# Patient Record
Sex: Male | Born: 1945 | Race: White | Hispanic: No | Marital: Married | Smoking: Never smoker
Health system: Southern US, Community
[De-identification: ages and names within clinical notes are randomized; demographics above are authoritative.]

## PROBLEM LIST (undated history)

## (undated) DIAGNOSIS — E079 Disorder of thyroid, unspecified: Secondary | ICD-10-CM

## (undated) DIAGNOSIS — E7800 Pure hypercholesterolemia, unspecified: Secondary | ICD-10-CM

## (undated) HISTORY — PX: APPENDECTOMY: SHX54

## (undated) HISTORY — PX: HERNIA REPAIR: SHX51

## (undated) HISTORY — PX: TONSILLECTOMY: SUR1361

---

## 2018-12-23 ENCOUNTER — Encounter: Payer: Self-pay | Admitting: Emergency Medicine

## 2018-12-23 ENCOUNTER — Emergency Department: Payer: No Typology Code available for payment source

## 2018-12-23 ENCOUNTER — Other Ambulatory Visit: Payer: Self-pay

## 2018-12-23 ENCOUNTER — Emergency Department
Admission: EM | Admit: 2018-12-23 | Discharge: 2018-12-23 | Disposition: A | Discharge status: Home / Self Care | Payer: No Typology Code available for payment source | Attending: Student in an Organized Health Care Education/Training Program | Admitting: Student in an Organized Health Care Education/Training Program

## 2018-12-23 DIAGNOSIS — R42 Dizziness and giddiness: Secondary | ICD-10-CM | POA: Diagnosis not present

## 2018-12-23 HISTORY — DX: Disorder of thyroid, unspecified: E07.9

## 2018-12-23 HISTORY — DX: Pure hypercholesterolemia, unspecified: E78.00

## 2018-12-23 LAB — COMPREHENSIVE METABOLIC PANEL
ALT: 20 U/L (ref 0–44)
AST: 21 U/L (ref 15–41)
Albumin: 4.1 g/dL (ref 3.5–5.0)
Alkaline Phosphatase: 103 U/L (ref 38–126)
Anion gap: 7 (ref 5–15)
BUN: 17 mg/dL (ref 8–23)
CO2: 26 mmol/L (ref 22–32)
Calcium: 9 mg/dL (ref 8.9–10.3)
Chloride: 106 mmol/L (ref 98–111)
Creatinine, Ser: 0.96 mg/dL (ref 0.61–1.24)
GFR calc Af Amer: >60 mL/min (ref >60)
GFR calc non Af Amer: >60 mL/min (ref >60)
Glucose, Bld: 152 mg/dL — ABNORMAL HIGH (ref 70–99)
Potassium: 3.6 mmol/L (ref 3.5–5.1)
Sodium: 139 mmol/L (ref 135–145)
Total Bilirubin: 0.9 mg/dL (ref 0.3–1.2)
Total Protein: 7 g/dL (ref 6.5–8.1)

## 2018-12-23 LAB — TROPONIN I (HIGH SENSITIVITY): Troponin I (High Sensitivity): 2 ng/L (ref <18)

## 2018-12-23 LAB — CBC WITH DIFFERENTIAL/PLATELET
Abs Immature Granulocytes: 0.01 10*3/uL (ref 0.00–0.07)
Basophils Absolute: 0.1 10*3/uL (ref 0.0–0.1)
Basophils Relative: 1 %
Eosinophils Absolute: 0.2 10*3/uL (ref 0.0–0.5)
Eosinophils Relative: 3 %
HCT: 40.8 % (ref 39.0–52.0)
Hemoglobin: 13.5 g/dL (ref 13.0–17.0)
Immature Granulocytes: 0 %
Lymphocytes Relative: 25 %
Lymphs Abs: 1.5 10*3/uL (ref 0.7–4.0)
MCH: 29.3 pg (ref 26.0–34.0)
MCHC: 33.1 g/dL (ref 30.0–36.0)
MCV: 88.5 fL (ref 80.0–100.0)
Monocytes Absolute: 0.7 10*3/uL (ref 0.1–1.0)
Monocytes Relative: 11 %
Neutro Abs: 3.8 10*3/uL (ref 1.7–7.7)
Neutrophils Relative %: 60 %
Platelets: 205 10*3/uL (ref 150–400)
RBC: 4.61 MIL/uL (ref 4.22–5.81)
RDW: 13.1 % (ref 11.5–15.5)
WBC: 6.2 10*3/uL (ref 4.0–10.5)
nRBC: 0 % (ref 0.0–0.2)
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
## 2018-12-23 NOTE — ED Provider Notes (Signed)
[facility] Emergency Department Provider Note    First MD Initiated Contact with Patient 12/23/18 1714     (approximate)  I have reviewed the triage vital signs and the nursing notes.   HISTORY  Chief Complaint Dizziness    HPI Robert Castillo is a 73 y.o. male below listed past medical history presents the ER for several weeks of intermittent lightheadedness.  States it frequently occurs while he is at work.  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  States he frequently feels lightheaded and almost foggy but denies any blurry vision, numbness, tingling, lateralizing weakness.  Does not feel like the room is spinning around.  States that last episode was last night.  States it is frequently very brief in nature.  Denies any chest pain or shortness of breath.  Denies any nausea or vomiting.    Past Medical History:  Diagnosis Date  . High cholesterol   . Thyroid disease    No family history on file. Past Surgical History:  Procedure Laterality Date  . APPENDECTOMY    . HERNIA REPAIR    . TONSILLECTOMY     There are no active problems to display for this patient.     Prior to Admission medications   Not on File    Allergies Patient has no allergy information on record.    Social History Social History   Tobacco Use  . Smoking status: Never Smoker  . Smokeless tobacco: Never Used  Substance Use Topics  . Alcohol use: Never    Frequency: Never  . Drug use: Not on file    Review of Systems Patient denies headaches, rhinorrhea, blurry vision, numbness, shortness of breath, chest pain, edema, cough, abdominal pain, nausea, vomiting, diarrhea, dysuria, fevers, rashes or hallucinations unless otherwise stated above in HPI. ____________________________________________   PHYSICAL EXAM:  VITAL SIGNS: Vitals:   12/23/18 1554  BP: (!) 153/79  Pulse:  87  Resp: 17  Temp: 98.5 F (36.9 C)  SpO2: 93%    Constitutional: Alert and oriented.  Eyes: Conjunctivae are normal.  Head: Atraumatic. Nose: No congestion/rhinnorhea. Mouth/Throat: Mucous membranes are moist.   Neck: No stridor. Painless ROM.  Cardiovascular: Normal rate, regular rhythm.  No murmurs appreciated.  Good peripheral circulation. Respiratory: Normal respiratory effort.  No retractions. Lungs CTAB. Gastrointestinal: Soft and nontender. No distention. No abdominal bruits. No CVA tenderness. Genitourinary:  Musculoskeletal: No lower extremity tenderness nor edema.  No joint effusions. Neurologic:  CN- intact.  No facial droop, Normal FNF.  Normal heel to shin.  Sensation intact bilaterally. Normal speech and language. No gross focal neurologic deficits are appreciated. No gait instability.  Benign HINTs exam Skin:  Skin is warm, dry and intact. No rash noted. Psychiatric: Mood and affect are normal. Speech and behavior are normal.  ____________________________________________   LABS (all labs ordered are listed, but only abnormal results are displayed)  Results for orders placed or performed during the hospital encounter of 12/23/18 (from the past 24 hour(s))  CBC with Differential     Status: None   Collection Time: 12/23/18  3:58 PM  Result Value Ref Range   WBC 6.2 4.0 - 10.5 K/uL   RBC 4.61 4.22 - 5.81 MIL/uL   Hemoglobin 13.5 13.0 - 17.0 g/dL   HCT 16.140.8 09.639.0 - 04.552.0 %   MCV 88.5 80.0 - 100.0 fL   MCH  29.3 26.0 - 34.0 pg   MCHC 33.1 30.0 - 36.0 g/dL   RDW 96.013.1 45.411.5 - 09.815.5 %   Platelets 205 150 - 400 K/uL   nRBC 0.0 0.0 - 0.2 %   Neutrophils Relative % 60 %   Neutro Abs 3.8 1.7 - 7.7 K/uL   Lymphocytes Relative 25 %   Lymphs Abs 1.5 0.7 - 4.0 K/uL   Monocytes Relative 11 %   Monocytes Absolute 0.7 0.1 - 1.0 K/uL   Eosinophils Relative 3 %   Eosinophils Absolute 0.2 0.0 - 0.5 K/uL   Basophils Relative 1 %   Basophils Absolute 0.1 0.0 - 0.1 K/uL    Immature Granulocytes 0 %   Abs Immature Granulocytes 0.01 0.00 - 0.07 K/uL  Comprehensive metabolic panel     Status: Abnormal   Collection Time: 12/23/18  3:58 PM  Result Value Ref Range   Sodium 139 135 - 145 mmol/L   Potassium 3.6 3.5 - 5.1 mmol/L   Chloride 106 98 - 111 mmol/L   CO2 26 22 - 32 mmol/L   Glucose, Bld 152 (H) 70 - 99 mg/dL   BUN 17 8 - 23 mg/dL   Creatinine, Ser 1.190.96 0.61 - 1.24 mg/dL   Calcium 9.0 8.9 - 14.710.3 mg/dL   Total Protein 7.0 6.5 - 8.1 g/dL   Albumin 4.1 3.5 - 5.0 g/dL   AST 21 15 - 41 U/L   ALT 20 0 - 44 U/L   Alkaline Phosphatase 103 38 - 126 U/L   Total Bilirubin 0.9 0.3 - 1.2 mg/dL   GFR calc non Af Amer >60 >60 mL/min   GFR calc Af Amer >60 >60 mL/min   Anion gap 7 5 - 15  Troponin I (High Sensitivity)     Status: None   Collection Time: 12/23/18  3:58 PM  Result Value Ref Range   Troponin I (High Sensitivity) 2 <18 ng/L   ____________________________________________  EKG My review and personal interpretation at Time: 15:54   Indication: dizziness  Rate: 90  Rhythm: sinus Axis: normal Other: normal intervals, no stemi ____________________________________________  RADIOLOGY  I personally reviewed all radiographic images ordered to evaluate for the above acute complaints and reviewed radiology reports and findings.  These findings were personally discussed with the patient.  Please see medical record for radiology report.  ____________________________________________   PROCEDURES  Procedure(s) performed:  Procedures    Critical Care performed: no ____________________________________________   INITIAL IMPRESSION / ASSESSMENT AND PLAN / ED COURSE  Pertinent labs & imaging results that were available during my care of the patient were reviewed by me and considered in my medical decision making (see chart for details).   DDX: Dehydration, orthostasis, anemia, CVA, vertigo, ACS or dysrhythmia.  Robert Castillo is a 73 y.o. who presents  to the ED with symptoms of lightheadedness intermittently over the past several weeks.  He is afebrile hemodynamically stable well-appearing.  His neuro exam is reassuring without any deficits.  Blood work is reassuring.  EKG shows no evidence of ischemia and his troponin is negative.  Chest x-ray shows no evidence of cardiomegaly or infiltrates.  No evidence of effusion.  Had mild increase in heart rate with standing but not overtly orthostatic.  Does seem that symptoms are more consistent with dehydration.  Will encourage p.o. intake and follow-up with PCP.     The patient was evaluated in Emergency Department today for the symptoms described in the history of present illness. He/she was evaluated  in the context of the global COVID-19 pandemic, which necessitated consideration that the patient might be at risk for infection with the SARS-CoV-2 virus that causes COVID-19. Institutional protocols and algorithms that pertain to the evaluation of patients at risk for COVID-19 are in a state of rapid change based on information released by regulatory bodies including the CDC and federal and state organizations. These policies and algorithms were followed during the patient's care in the ED.  As part of my medical decision making, I reviewed the following data within the [facility] notes reviewed and incorporated, Labs reviewed, notes from prior ED visits and [place] Controlled Substance Database   ____________________________________________   FINAL CLINICAL IMPRESSION(S) / ED DIAGNOSES  Final diagnoses:  Lightheadedness      NEW MEDICATIONS STARTED DURING THIS VISIT:  New Prescriptions   No medications on file     Note:  This document was prepared using Dragon voice recognition software and may include unintentional dictation errors.    Merlyn Lot, MD 12/23/18 [phone]

## 2018-12-23 NOTE — ED Notes (Signed)
ED Provider at bedside. 

## 2018-12-23 NOTE — ED Notes (Signed)
Pt alert and oriented X 4, stable for discharge. RR even and unlabored, color WNL. Discussed discharge instructions and follow up when appropriate. Instructed to follow up with ER for any life threatening symptoms or concerns that patient or family of patient may have  

## 2018-12-23 NOTE — Discharge Instructions (Addendum)
Follow up with Dr. Heber Beresford.  Please drink plenty of fluids for the next few days with the goal of having clear urine.

## 2020-10-05 IMAGING — CT CT HEAD WITHOUT CONTRAST
3 series · 16 of 47 positions shown, 19 images · non-contrast
Comparison: None.

CLINICAL DATA: Episodic vertigo.  Dizziness 2-3 weeks

EXAM:
CT HEAD WITHOUT CONTRAST
TECHNIQUE: Contiguous axial images were obtained from the base of the skull
through the vertex without intravenous contrast.

[Series 2: head wo · axial · 0.44mm/px · z∈[-131,-6]mm · 10 of 31 slices shown, 13 images]
[im 3/31  brain]
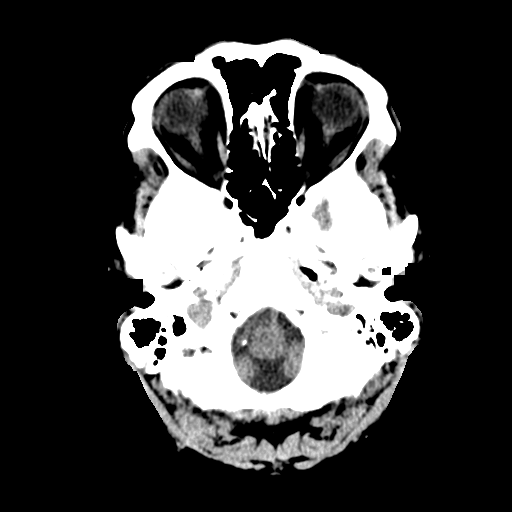
[im 3/31  bone]
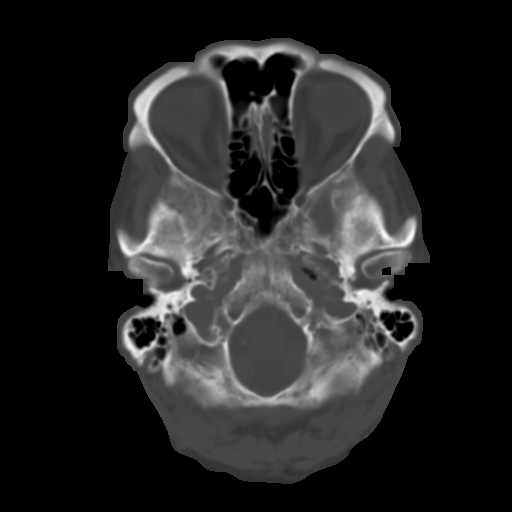
[im 6/31  brain]
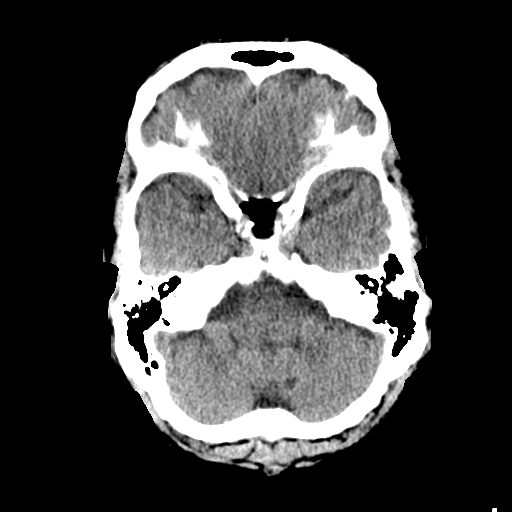
[im 9/31  brain]
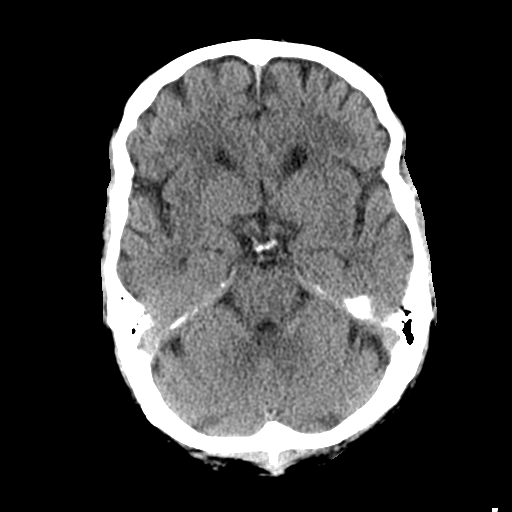
[im 11/31  brain]
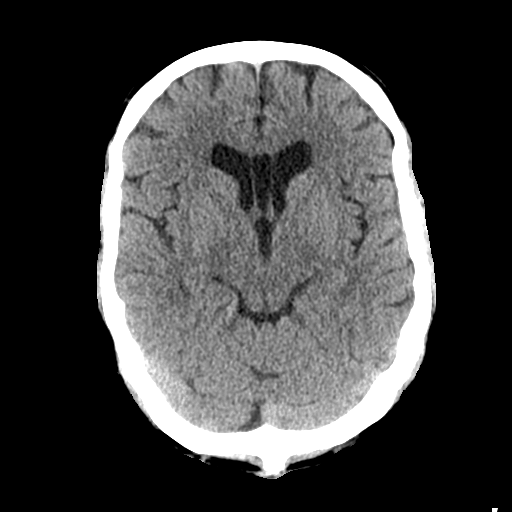
[im 14/31  brain]
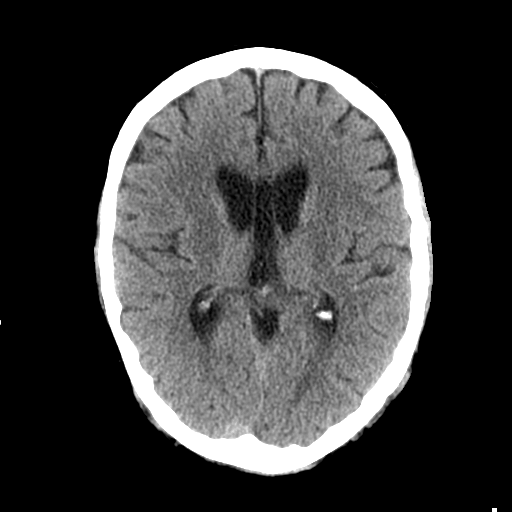
[im 14/31  bone]
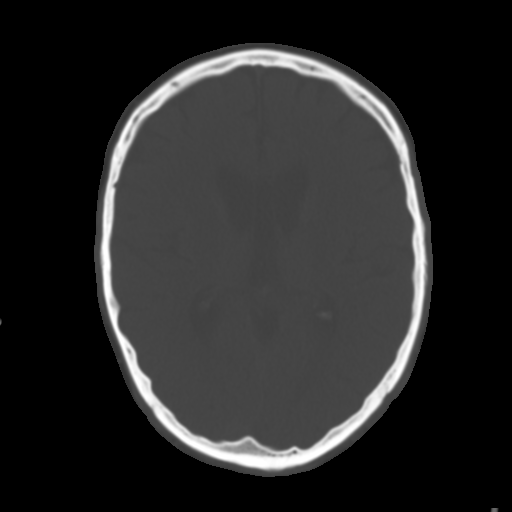
[im 17/31  brain]
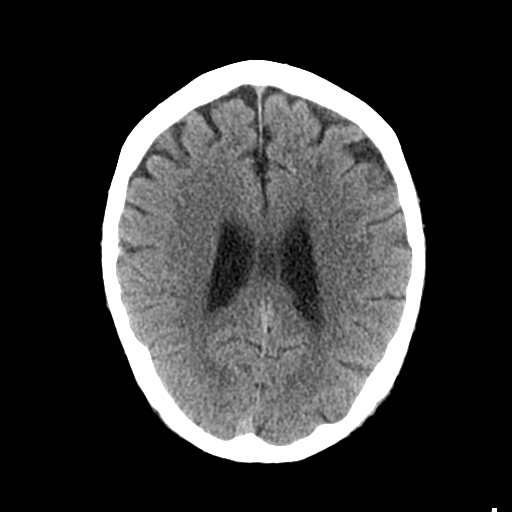
[im 20/31  brain]
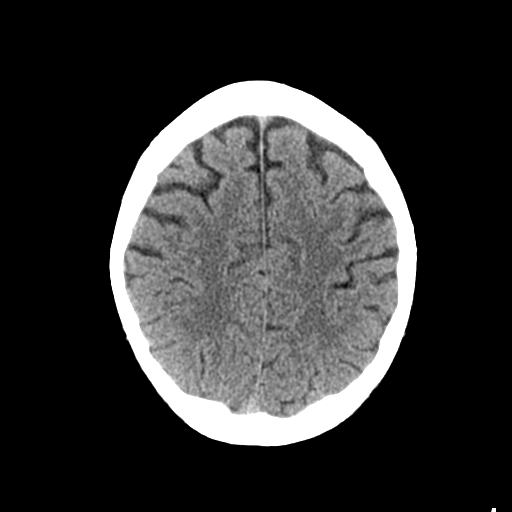
[im 23/31  brain]
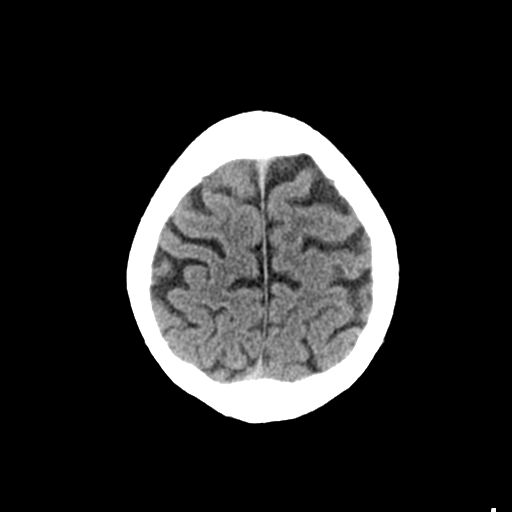
[im 25/31  brain]
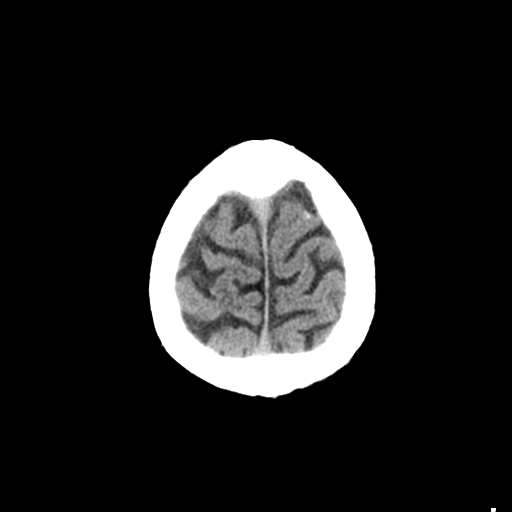
[im 25/31  bone]
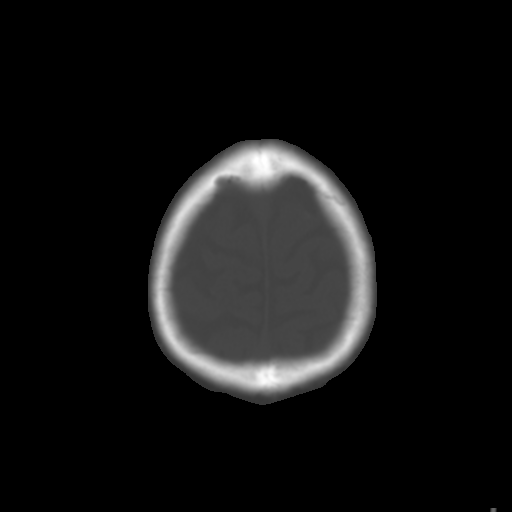
[im 28/31  brain]
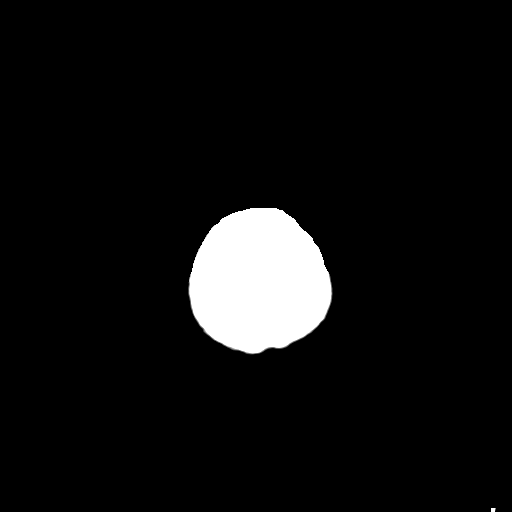

[Series 4: coronal soft tissue · coronal · 0.31mm/px · 3 of 66 slices shown]
[im 22/66  brain]
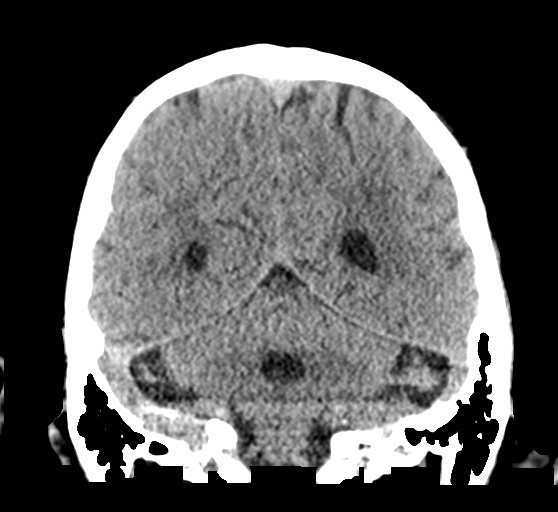
[im 29/66  brain]
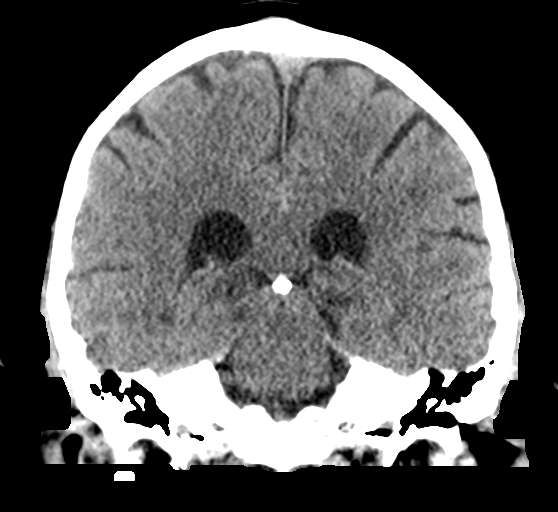
[im 37/66  brain]
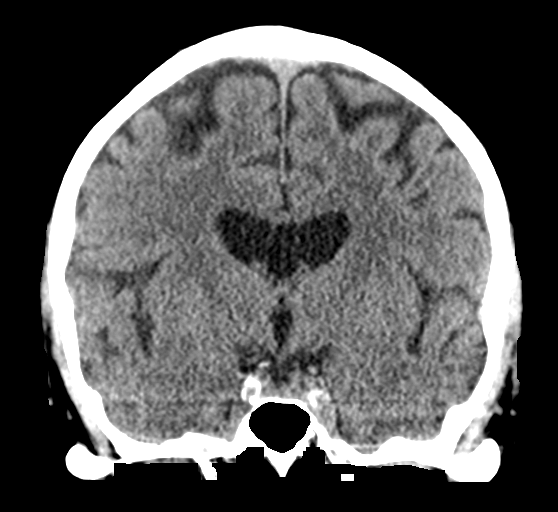

[Series 5: sagittal soft tissue · sagittal · 0.31mm/px · 3 of 55 slices shown]
[im 19/55  brain]
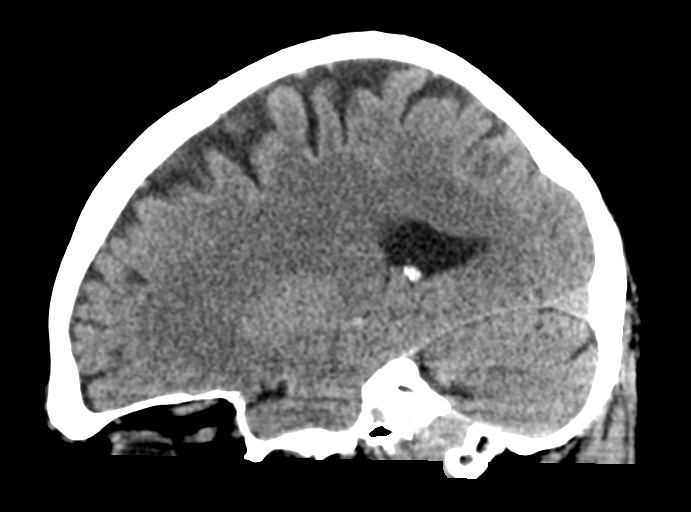
[im 28/55  brain]
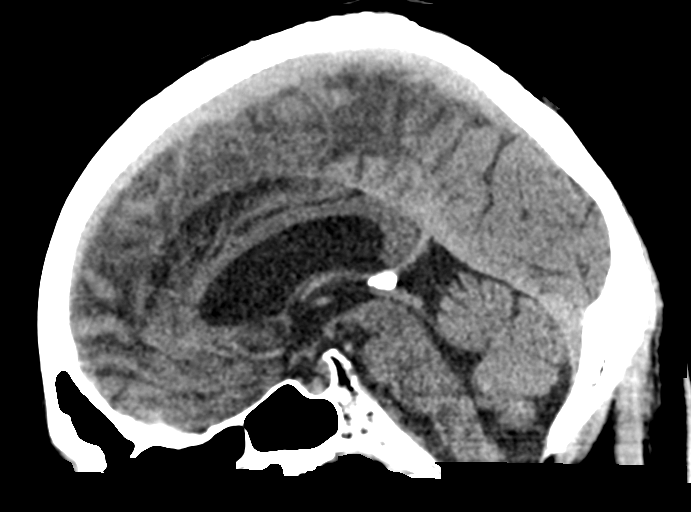
[im 37/55  brain]
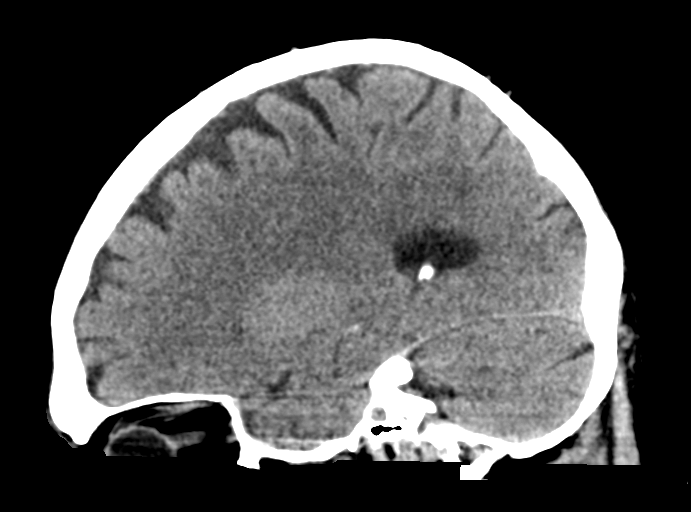

[16 of 47 positions shown; findings below may reference images not displayed]

FINDINGS: Brain: Mild atrophy. Negative for hydrocephalus. Negative for acute
infarct. Negative for hemorrhage, mass, or fluid collection. No
midline shift.

Vascular: Negative for hyperdense vessel

Skull: Negative

Sinuses/Orbits: Negative

Other: None
IMPRESSION: Mild atrophy.  No acute abnormality.

## 2020-10-05 IMAGING — DX PORTABLE CHEST - 1 VIEW
1 series · 2 of 2 positions shown · non-contrast
Comparison: None.

CLINICAL DATA: Dizziness for 2-3 weeks.

EXAM:
PORTABLE CHEST 1 VIEW

[Series 1: chest ap · 0.14mm/px · 2 of 2 slices shown]
[im 1/2]
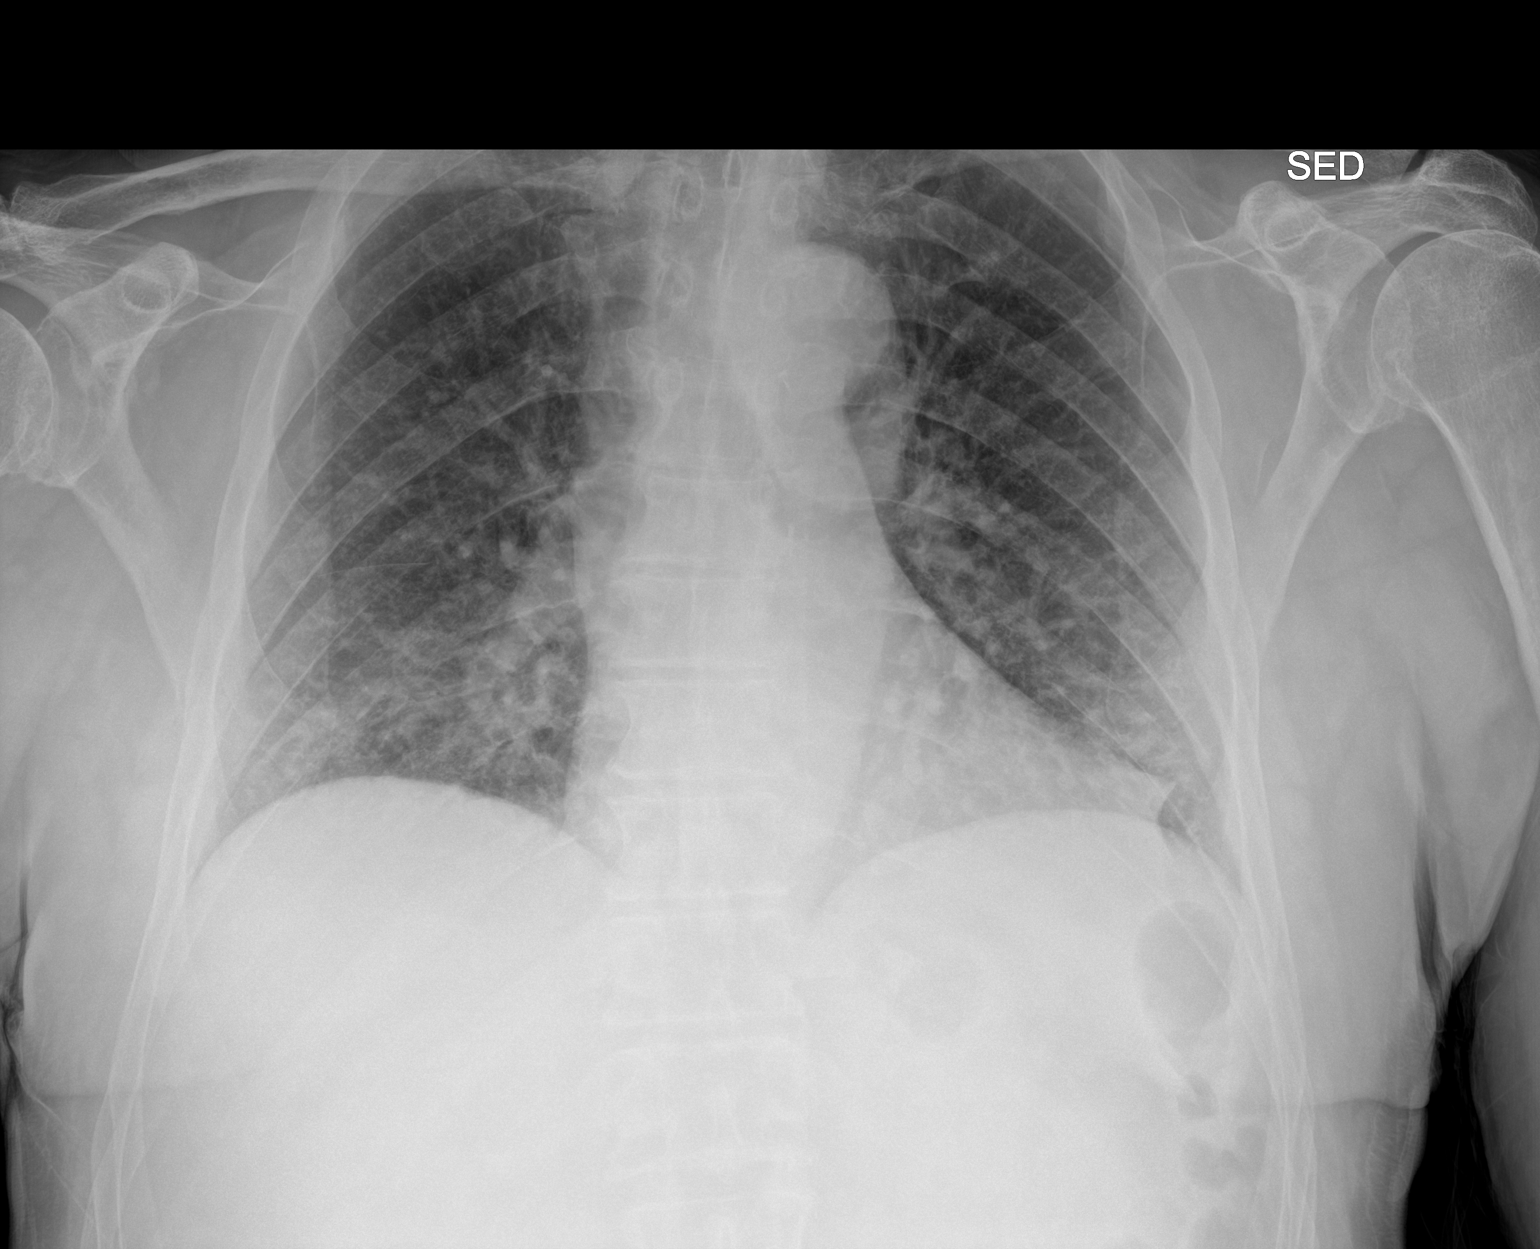
[im 2/2]
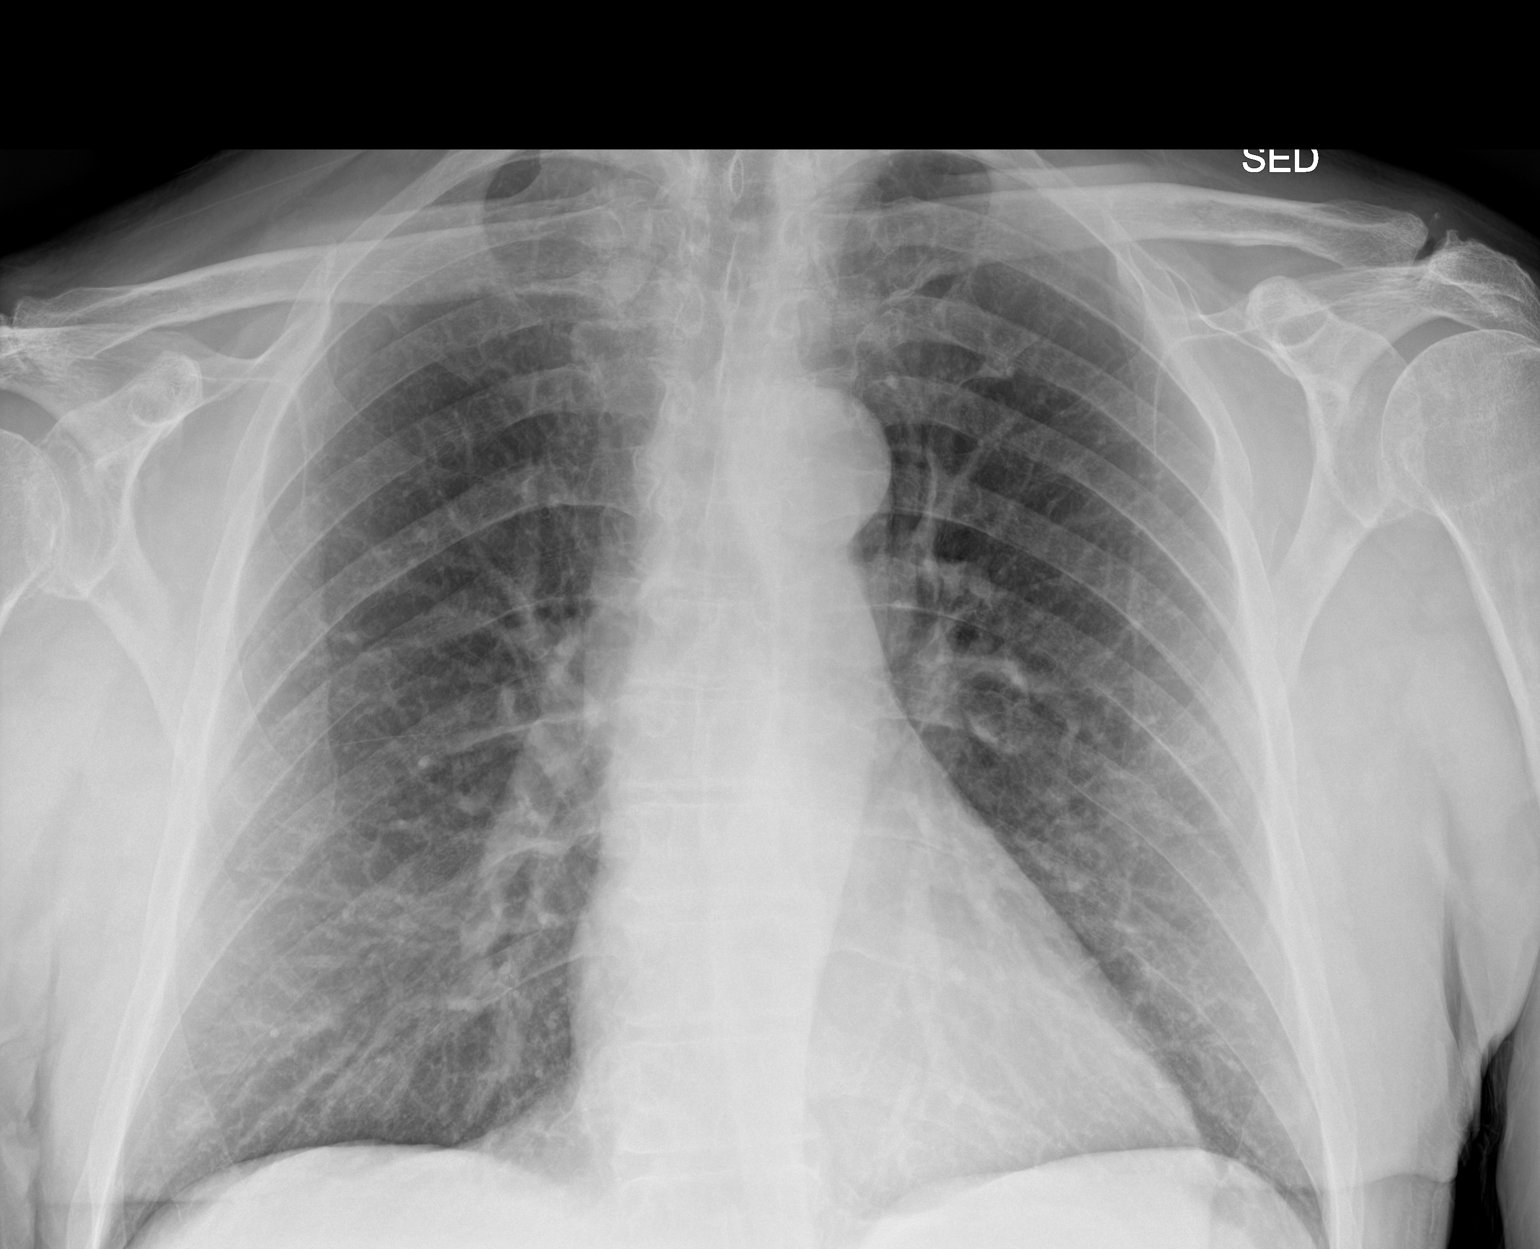

[2 of 2 positions shown; findings below may reference images not displayed]

FINDINGS: The lungs are clear. Heart size is normal. No pneumothorax or
pleural effusion. No acute or focal bony abnormality.
IMPRESSION: No acute disease.

## 2023-06-07 NOTE — Progress Notes (Signed)
 Patient ID: Robert Castillo is a 78 y.o. male who presents for a Medicare Annual Wellness Visit, with additional evaluation and management of Shortness of breath (see separate note in this encounter).  Informant: Patient came to appointment alone..  Assessment/Plan:    Medicare Annual Wellness Visit  Risks identified The patient was educated and counseled on the following: There were no significant safety risks identified at today's visit. Is the patient currently using a prescribed opioid medication? No  Advanced care planning -- Patient has a living will and I am willing to follow the patient's wishes as expressed in the advance directive.  Personalized Prevention Plan A personalized prevention plan was reviewed with the patient and a written copy was provided for personal records (available for review in Patient Instructions).  The following preventive services were advised: Influenza: Current Flu Tdap: will pursue vaccine at local pharmacy Shingrix: will pursue vaccine at local pharmacy RSV: will pursue vaccine at local pharmacy PSA: risks and benefits of prostate cancer screening were carefully reviewed -- ordered  No follow-ups on file.  Subjective:   Health Risk Assessment (HRA)  The HRA was completed and reviewed. The chart was updated where appropriate.  Medical/Family History No Known Allergies  Outpatient Medications Prior to Visit  Medication Sig Dispense Refill  . atorvastatin (LIPITOR) 40 MG tablet Take 1 tablet (40 mg total) by mouth daily. 90 tablet 3  . levothyroxine (SYNTHROID) 88 MCG tablet Take 1 tablet (88 mcg total) by mouth daily. 90 tablet 0  . mv-mins-folic-lycopene-ginkgo (ONE-A-DAY MEN'S 50PLUS,GINKGO,) 400-300-120 mcg-mcg-mg Tab Take by mouth.    SABRA UNABLE TO FIND Prevagen one daily     No facility-administered medications prior to visit.    Past Medical History:  Diagnosis Date  . Diverticulosis of colon 06/24/2020  . Hyperlipemia   .  Hypothyroidism   . Status post tooth extraction 06/08/14   wisdom teeth left upper and lower    Past Surgical History:  Procedure Laterality Date  . APPENDECTOMY     age 74  . PR COLSC FLX W/REMOVAL LESION BY HOT BX FORCEPS N/A 06/24/2020   Procedure: COLONOSCOPY, FLEXIBLE, PROXIMAL TO SPLENIC FLEXURE; W/REMOVAL TUMOR/POLYP/OTHER LESION, HOT BX FORCEP/CAUTE;  Surgeon: Raghid Everette Ruddy, MD;  Location: [facility];  Service: General Surgery  . PR REPAIR ING HERNIA,5+Y/O,REDUCIBL Right 07/12/2015   Procedure: REPAIR INITIAL INGUINAL HERNIA, AGE 48 YEARS OR OLDER; REDUCIBLE;  Surgeon: Renella Everette Ruddy, MD;  Location: [facility];  Service: General Surgery  . PR UPPER GI ENDOSCOPY,BIOPSY N/A 06/24/2020   Procedure: UGI ENDOSCOPY; WITH BIOPSY, SINGLE OR MULTIPLE;  Surgeon: Renella Everette Ruddy, MD;  Location: [facility];  Service: General Surgery  . TONSILLECTOMY     age [age]    Family History  Problem Relation Age of Onset  . Cancer Mother        lymphnode  . Alzheimer's disease Father   . Heart disease Father   . Other Sister        male issues not sure details  . Arthritis Sister   . Diabetes Brother   . No Known Problems Daughter   . No Known Problems Son   . No Known Problems Son   . Alcohol abuse Neg Hx   . Drug abuse Neg Hx   . Asthma Neg Hx   . COPD Neg Hx   . Depression Neg Hx   . Hyperlipidemia Neg Hx   . Hypertension Neg Hx   . Stroke Neg Hx   .  Thyroid disease Neg Hx   . Hearing loss Neg Hx   . Vision loss Neg Hx   . Mental illness Neg Hx   . Mental Retardation Neg Hx   . Miscarriages / Stillbirths Neg Hx   . Learning disabilities Neg Hx     Functional Abilities/ Level of Safety Home safety concerns: No ADL needs: none iADL needs: none Fall in the last year: No Hearing difficulty: No     Psychosocial risks Past history of depression: No Today PHQ-2 Total Score : 0    Alcohol risk: No Social History   Tobacco Use  . Smoking status: Never    Passive  exposure: Never  . Smokeless tobacco: Never  Vaping Use  . Vaping status: Never Used  Substance Use Topics  . Alcohol use: No  . Drug use: No    Preventive Care Health Maintenance  Topic Date Due  . DTaP/Tdap/Td Vaccines (1 - Tdap) Never done  . Zoster Vaccines (1 of 2) Never done  . COVID-19 Vaccine (4 - 2024-25 season) 01/06/2023  . Influenza Vaccine (1) 01/06/2023  . Medicare Annual Wellness Visit (AWV)  07/05/2023  . Pneumococcal Vaccine 50+  Completed  . Hepatitis C Screen  Completed  . Colonoscopy  Discontinued     Immunization History  Administered Date(s) Administered  . COVID-19 VACCINE,MRNA(MODERNA)(PF) 07/25/2019, 08/28/2019, 09/21/2019  . Influenza Virus Vaccine, unspecified formulation 02/26/2014, 02/16/2016, 02/12/2017, 02/13/2018, 03/05/2019  . PNEUMOCOCCAL POLYSACCHARIDE 23-VALENT 08/12/2013  . Pneumococcal Conjugate 13-Valent 09/06/2014    Current Providers  Patient Care Team: Rosan Cheryal Heinz, MD as PCP - General (Internal Medicine) Rosan Cheryal Heinz, MD as PCP - Deberah Ruddy, Renella Manns, MD (General Surgery) [facility] (Dentistry)  Current Suppliers (DME) None   Objective:   Vital Signs BP 142/70 (BP Site: L Arm, BP Position: Sitting, BP Cuff Size: X-Large)   Pulse 52   Temp 37 C (98.6 F) (Oral)   Resp 20   Ht 180.3 cm (5' 11)   Wt 92.1 kg (203 lb)   SpO2 90%   BMI 28.31 kg/m   Pain Score:      06/07/23 0820  PainSc: 0-No pain     Hearing Test  Whisper Test:  normal  Mobility Test  Patient is able to rise from chair without assistance and ambulate without aid: Yes or Timed Up-and-Go test:   < 10 seconds: Freely mobile           Cognitive Assessment It is apparent from the interview that the patient is cognitively intact with high functioning intellectual faculties. or Mini-Cog score:   3 words recalled

## 2023-11-27 ENCOUNTER — Emergency Department (HOSPITAL_COMMUNITY): Payer: Medicare (Managed Care)

## 2023-11-27 ENCOUNTER — Encounter (HOSPITAL_COMMUNITY): Payer: Self-pay | Admitting: Pharmacy Technician

## 2023-11-27 ENCOUNTER — Emergency Department (HOSPITAL_COMMUNITY)
Admission: EM | Admit: 2023-11-27 | Discharge: 2023-11-27 | Disposition: A | Discharge status: Home / Self Care | Payer: Medicare (Managed Care) | Attending: Emergency Medicine | Admitting: Emergency Medicine

## 2023-11-27 ENCOUNTER — Other Ambulatory Visit: Payer: Self-pay

## 2023-11-27 DIAGNOSIS — I10 Essential (primary) hypertension: Secondary | ICD-10-CM | POA: Insufficient documentation

## 2023-11-27 DIAGNOSIS — E860 Dehydration: Secondary | ICD-10-CM | POA: Insufficient documentation

## 2023-11-27 DIAGNOSIS — R531 Weakness: Secondary | ICD-10-CM | POA: Insufficient documentation

## 2023-11-27 LAB — URINALYSIS, ROUTINE W REFLEX MICROSCOPIC
Bilirubin Urine: NEGATIVE
Glucose, UA: NEGATIVE mg/dL
Hgb urine dipstick: NEGATIVE
Ketones, ur: NEGATIVE mg/dL
Leukocytes,Ua: NEGATIVE
Nitrite: NEGATIVE
Protein, ur: NEGATIVE mg/dL
Specific Gravity, Urine: 1.005 (ref 1.005–1.030)
pH: 7 (ref 5.0–8.0)

## 2023-11-27 LAB — BASIC METABOLIC PANEL WITH GFR
Anion gap: 11 (ref 5–15)
BUN: 15 mg/dL (ref 8–23)
CO2: 25 mmol/L (ref 22–32)
Calcium: 9.2 mg/dL (ref 8.9–10.3)
Chloride: 104 mmol/L (ref 98–111)
Creatinine, Ser: 0.95 mg/dL (ref 0.61–1.24)
GFR, Estimated: >60 mL/min (ref >60)
Glucose, Bld: 93 mg/dL (ref 70–99)
Potassium: 3.9 mmol/L (ref 3.5–5.1)
Sodium: 140 mmol/L (ref 135–145)

## 2023-11-27 LAB — CBC
HCT: 44 % (ref 39.0–52.0)
Hemoglobin: 14.4 g/dL (ref 13.0–17.0)
MCH: 29.2 pg (ref 26.0–34.0)
MCHC: 32.7 g/dL (ref 30.0–36.0)
MCV: 89.2 fL (ref 80.0–100.0)
Platelets: 237 K/uL (ref 150–400)
RBC: 4.93 MIL/uL (ref 4.22–5.81)
RDW: 13.1 % (ref 11.5–15.5)
WBC: 8.6 K/uL (ref 4.0–10.5)
nRBC: 0 % (ref 0.0–0.2)

## 2023-11-27 LAB — TROPONIN I (HIGH SENSITIVITY): Troponin I (High Sensitivity): 6 ng/L (ref <18)

## 2023-11-27 MED ORDER — SODIUM CHLORIDE 0.9 % IV BOLUS
1000.0000 mL | Freq: Once | INTRAVENOUS | Status: AC
  Administered 2023-11-27: 1000 mL via INTRAVENOUS

## 2023-11-27 NOTE — ED Provider Notes (Signed)
 [facility] EMERGENCY DEPARTMENT AT [facility] Provider Note   CSN: [id number] Arrival date & time: 11/27/23  1447     Patient presents with: No chief complaint on file.   Robert Castillo is a 78 y.o. male past medical history significant for hyperlipidemia, hypertension, thyroid disease who presents with concern for 4 days of weakness, some swimmy headed feeling, difficulty walking due to lightheadedness intermittently.  Reports he was working in the yard and soaked through 2 T-shirts while working in the yard a few days ago and felt like he did not drink enough fluids.  He reports that he felt like he was in a pass out while laying in bed shortly thereafter.  He reports that he had some mild discomfort in his left arm, no significant chest pain, no shortness of breath.  He reports that he overall feels improved today but was somewhat hypertensive earlier, pressure 180/110.  He ambulated to the bathroom just prior to our interview and reports that he did not feel lightheaded at all.  Reports similar episode of near syncope around 10 years ago and says that at that time it was secondary to dehydration.   HPI     Prior to Admission medications   Not on File    Allergies: Patient has no known allergies.    Review of Systems  All other systems reviewed and are negative.   Updated Vital Signs BP (!) 154/78 (BP Location: Left Arm)   Pulse 76   Temp 98.5 F (36.9 C) (Oral)   Resp 16   SpO2 95%   Physical Exam Vitals and nursing note reviewed.  Constitutional:      General: He is not in acute distress.    Appearance: Normal appearance.  HENT:     Head: Normocephalic and atraumatic.  Eyes:     General:        Right eye: No discharge.        Left eye: No discharge.  Cardiovascular:     Rate and Rhythm: Normal rate and regular rhythm.     Heart sounds: No murmur heard.    No friction rub. No gallop.  Pulmonary:     Effort: Pulmonary effort is normal.     Breath  sounds: Normal breath sounds.  Abdominal:     General: Bowel sounds are normal.     Palpations: Abdomen is soft.  Skin:    General: Skin is warm and dry.     Capillary Refill: Capillary refill takes less than 2 seconds.  Neurological:     Mental Status: He is alert and oriented to person, place, and time.     Comments: Cranial nerves II through XII grossly intact.  Intact finger-nose, intact heel-to-shin.  Romberg negative, gait normal.  Alert and oriented x3.  Moves all 4 limbs spontaneously, normal coordination.  No pronator drift.  Intact strength 5 out of 5 bilateral upper and lower extremities.    Psychiatric:        Mood and Affect: Mood normal.        Behavior: Behavior normal.     (all labs ordered are listed, but only abnormal results are displayed) Labs Reviewed  CBC  BASIC METABOLIC PANEL WITH GFR  URINALYSIS, ROUTINE W REFLEX MICROSCOPIC  TROPONIN I (HIGH SENSITIVITY)    EKG: None  Radiology: DG Chest 1 View Result Date: 11/27/2023 CLINICAL DATA:  Chest pain EXAM: CHEST  1 VIEW COMPARISON:  12/23/2018 FINDINGS: No acute airspace disease or pleural effusion.  Stable cardiomediastinal silhouette. No pneumothorax. IMPRESSION: No active disease. Electronically Signed   By: Luke Bun M.D.   On: 11/27/2023 16:56     Procedures   Medications Ordered in the ED  sodium chloride  0.9 % bolus 1,000 mL (1,000 mLs Intravenous New Bag/Given 11/27/23 1704)                                    Medical Decision Making Amount and/or Complexity of Data Reviewed Labs: ordered. Radiology: ordered.   This patient is a 78 y.o. male  who presents to the ED for concern of weakness.   Differential diagnoses prior to evaluation: The emergent differential diagnosis includes, but is not limited to,  CVA, spinal cord injury, ACS, arrhythmia, syncope, orthostatic hypotension, sepsis, hypoglycemia, hypoxia, electrolyte disturbance, endocrine disorder, anemia, environmental exposure,  polypharmacy . This is not an exhaustive differential.   Past Medical History / Co-morbidities / Social History: HLD, thyroid disease  Additional history: Chart reviewed. Pertinent results include: Reviewed family medicine visits, remote previous emergency department visits  Physical Exam: Physical exam performed. The pertinent findings include:  Cranial nerves II through XII grossly intact.  Intact finger-nose, intact heel-to-shin.  Romberg negative, gait normal.  Alert and oriented x3.  Moves all 4 limbs spontaneously, normal coordination.  No pronator drift.  Intact strength 5 out of 5 bilateral upper and lower extremities.  Vital signs stable other than mild hypertension, blood pressure 154/78, able to stand and ambulate without difficulty on my exam.  Lab Tests/Imaging studies: I personally interpreted labs/imaging and the pertinent results include: CBC unremarkable, BMP unremarkable, notably with normal creatinine, BUN.  Troponin x 1 in context of no active chest pain, 1 episode of lightheadedness a few days ago was normal at 6.  UA unremarkable.-Chest x-ray with no evidence of acute intrathoracic abnormality.  EKG with normal sinus rhythm. I agree with the radiologist interpretation.   Medications: I ordered medication including fluid bolus for presumed possible mild dehydration after working out in the hot sun a few days ago.  I have reviewed the patients home medicines and have made adjustments as needed.   Disposition: After consideration of the diagnostic results and the patients response to treatment, I feel that patient is stable for discharge with plan as above .   emergency department workup does not suggest an emergent condition requiring admission or immediate intervention beyond what has been performed at this time. The plan is: as above. The patient is safe for discharge and has been instructed to return immediately for worsening symptoms, change in symptoms or any other  concerns.   Final diagnoses:  Weakness  Dehydration    ED Discharge Orders     None          Rosan Sherlean VEAR DEVONNA 11/27/23 1846    Patt Alm Macho, MD 11/27/23 2230

## 2023-11-27 NOTE — ED Triage Notes (Signed)
 Pt bib ems from work with 4 days of weakness and difficulty walking with intermitttent L arm pain. Near syncopal episode while lying in bed 4 days ago. Hx thyroid issues and hypertension. 180/110 12 lead unremarkable 18g LFA

## 2023-11-27 NOTE — Discharge Instructions (Signed)
 Your workup today was reassuring, if you have new chest pain, shortness of breath, continued feeling like you are in a pass out I would recommend returning for further evaluation, otherwise please follow-up with your primary care doctor, to discuss blood pressure medication adjustments if it remains elevated at home.  Please drink plenty of fluids including electrolyte containing fluids, such as pedialyte, gatorade to help with dehydration.
